# Patient Record
Sex: Male | Born: 1965 | Race: Black or African American | Hispanic: No | Marital: Single | Smoking: Current every day smoker
Health system: Southern US, Community
[De-identification: ages and names within clinical notes are randomized; demographics above are authoritative.]

## PROBLEM LIST (undated history)

## (undated) DIAGNOSIS — J45909 Unspecified asthma, uncomplicated: Secondary | ICD-10-CM

## (undated) DIAGNOSIS — J449 Chronic obstructive pulmonary disease, unspecified: Secondary | ICD-10-CM

---

## 2000-12-29 ENCOUNTER — Emergency Department (HOSPITAL_COMMUNITY): Admission: EM | Admit: 2000-12-29 | Discharge: 2000-12-29 | Payer: Self-pay | Admitting: Emergency Medicine

## 2000-12-29 ENCOUNTER — Encounter: Payer: Self-pay | Admitting: Emergency Medicine

## 2018-09-24 ENCOUNTER — Emergency Department (HOSPITAL_COMMUNITY): Payer: Self-pay

## 2018-09-24 ENCOUNTER — Encounter (HOSPITAL_COMMUNITY): Payer: Self-pay | Admitting: Emergency Medicine

## 2018-09-24 ENCOUNTER — Emergency Department (HOSPITAL_COMMUNITY)
Admission: EM | Admit: 2018-09-24 | Discharge: 2018-09-24 | Disposition: A | Discharge status: Home / Self Care | Payer: Self-pay | Attending: Emergency Medicine | Admitting: Emergency Medicine

## 2018-09-24 ENCOUNTER — Other Ambulatory Visit: Payer: Self-pay

## 2018-09-24 DIAGNOSIS — R05 Cough: Secondary | ICD-10-CM | POA: Insufficient documentation

## 2018-09-24 DIAGNOSIS — Z79899 Other long term (current) drug therapy: Secondary | ICD-10-CM | POA: Insufficient documentation

## 2018-09-24 DIAGNOSIS — R059 Cough, unspecified: Secondary | ICD-10-CM

## 2018-09-24 DIAGNOSIS — F17210 Nicotine dependence, cigarettes, uncomplicated: Secondary | ICD-10-CM | POA: Insufficient documentation

## 2018-09-24 DIAGNOSIS — Z590 Homelessness: Secondary | ICD-10-CM | POA: Insufficient documentation

## 2018-09-24 DIAGNOSIS — J449 Chronic obstructive pulmonary disease, unspecified: Secondary | ICD-10-CM | POA: Insufficient documentation

## 2018-09-24 HISTORY — DX: Unspecified asthma, uncomplicated: J45.909

## 2018-09-24 HISTORY — DX: Chronic obstructive pulmonary disease, unspecified: J44.9

## 2018-09-24 LAB — BASIC METABOLIC PANEL
Anion gap: 11 (ref 5–15)
BUN: 11 mg/dL (ref 6–20)
CO2: 26 mmol/L (ref 22–32)
CREATININE: 1.06 mg/dL (ref 0.61–1.24)
Calcium: 8.7 mg/dL — ABNORMAL LOW (ref 8.9–10.3)
Chloride: 102 mmol/L (ref 98–111)
GFR calc Af Amer: >60 mL/min (ref >60)
Glucose, Bld: 101 mg/dL — ABNORMAL HIGH (ref 70–99)
Potassium: 3.8 mmol/L (ref 3.5–5.1)
Sodium: 139 mmol/L (ref 135–145)

## 2018-09-24 LAB — CBC WITH DIFFERENTIAL/PLATELET
Abs Immature Granulocytes: 0.01 10*3/uL (ref 0.00–0.07)
Basophils Absolute: 0 10*3/uL (ref 0.0–0.1)
Basophils Relative: 0 %
Eosinophils Absolute: 0 10*3/uL (ref 0.0–0.5)
Eosinophils Relative: 0 %
HCT: 39.4 % (ref 39.0–52.0)
Hemoglobin: 12.6 g/dL — ABNORMAL LOW (ref 13.0–17.0)
Immature Granulocytes: 0 %
Lymphocytes Relative: 18 %
Lymphs Abs: 0.8 10*3/uL (ref 0.7–4.0)
MCH: 29.6 pg (ref 26.0–34.0)
MCHC: 32 g/dL (ref 30.0–36.0)
MCV: 92.5 fL (ref 80.0–100.0)
Monocytes Absolute: 0.5 10*3/uL (ref 0.1–1.0)
Monocytes Relative: 11 %
Neutro Abs: 3.2 10*3/uL (ref 1.7–7.7)
Neutrophils Relative %: 71 %
Platelets: 148 10*3/uL — ABNORMAL LOW (ref 150–400)
RBC: 4.26 MIL/uL (ref 4.22–5.81)
RDW: 13.5 % (ref 11.5–15.5)
WBC: 4.5 10*3/uL (ref 4.0–10.5)
nRBC: 0 % (ref 0.0–0.2)

## 2018-09-24 MED ORDER — METHYLPREDNISOLONE SODIUM SUCC 125 MG IJ SOLR
125.0000 mg | Freq: Once | INTRAMUSCULAR | Status: AC
  Administered 2018-09-24: 125 mg via INTRAVENOUS
  Filled 2018-09-24: qty 2

## 2018-09-24 MED ORDER — SODIUM CHLORIDE 0.9 % IV BOLUS
1000.0000 mL | Freq: Once | INTRAVENOUS | Status: AC
  Administered 2018-09-24: 1000 mL via INTRAVENOUS

## 2018-09-24 MED ORDER — IBUPROFEN 200 MG PO TABS
600.0000 mg | ORAL_TABLET | Freq: Once | ORAL | Status: AC
  Administered 2018-09-24: 600 mg via ORAL
  Filled 2018-09-24: qty 3

## 2018-09-24 MED ORDER — ALBUTEROL SULFATE HFA 108 (90 BASE) MCG/ACT IN AERS
2.0000 | INHALATION_SPRAY | RESPIRATORY_TRACT | Status: DC
  Administered 2018-09-24: 2 via RESPIRATORY_TRACT
  Filled 2018-09-24: qty 6.7

## 2018-09-24 MED ORDER — PREDNISONE 20 MG PO TABS: 20.0000 mg | ORAL_TABLET | Freq: Every day | ORAL | 0 refills | Status: DC

## 2018-09-24 NOTE — Discharge Instructions (Addendum)
Chest x-ray showed no pneumonia.  We have given you inhaler.  Prescription for prednisone.  This is an expensive.  You need primary care follow-up.

## 2018-09-24 NOTE — ED Triage Notes (Signed)
Pt is homeless and BIB [facility] c/o cough x3 days. Warm to the touch, headache. Lung sounds are clear per EMS. +cough. EMS states "probably needs to speak with a social worker"

## 2018-09-24 NOTE — ED Notes (Signed)
Patient very drowsy, dozing off between questions. But very polite and apologetic when woken to answer.

## 2018-09-24 NOTE — ED Notes (Signed)
Bed: HO12 Expected date:  Expected time:  Means of arrival:  Comments: 19M fever cough

## 2018-09-24 NOTE — Care Management Note (Signed)
Case Management Note  CM consulted for medication assistance.  Discussed with Dr. Adriana Simas who advised he would re-contact CM once all results were available and course of treatment was decided.  No home medication noted at this time.  Placed Lavinia Sharps, NP on AVS for follow up.  No further CM needs noted at this time until further notice from Dr. Adriana Simas.  Anik Wesch, Lynnae Sandhoff, RN 09/24/2018, 3:57 PM

## 2018-09-25 NOTE — ED Provider Notes (Signed)
[facility] Note   CSN: [id number] Arrival date & time: 09/24/18  [id number]     History   Chief Complaint Chief Complaint  Patient presents with  . Cough    HPI Jonathan Beck is a 53 y.o. male.  Cough for 3 days without fever, sweats, chills.  Patient is homeless and has had trouble finding appropriate housing.  No rusty sputum.  No substernal chest pain, dyspnea, diaphoresis, nausea.  Past medical history includes asthma and COPD.  He is a smoker.  Severity symptoms is mild.  Nothing makes symptoms better or worse.     Past Medical History:  Diagnosis Date  . Asthma   . COPD (chronic obstructive pulmonary disease) (HCC)     There are no active problems to display for this patient.   History reviewed. No pertinent surgical history.      Home Medications    Prior to Admission medications   Medication Sig Start Date End Date Taking? Authorizing Provider  naproxen sodium (ALEVE) 220 MG tablet Take 440 mg by mouth daily as needed (pain).    Yes [provider]  predniSONE (DELTASONE) 20 MG tablet Take 1 tablet (20 mg total) by mouth daily with breakfast. 09/24/18   Donnetta Hutchingook, Jaquis Picklesimer, MD    Family History History reviewed. No pertinent family history.  Social History Social History   Tobacco Use  . Smoking status: Current Every Day Smoker    Packs/day: 2.00    Types: Cigarettes  . Smokeless tobacco: Never Used  Substance Use Topics  . Alcohol use: Yes    Comment: "I don't know, too many"  . Drug use: Yes    Types: Marijuana, Cocaine     Allergies   Bee venom and Penicillins   Review of Systems Review of Systems  All other systems reviewed and are negative.    Physical Exam Updated Vital Signs BP 109/88   Pulse 86   Temp 98.7 F (37.1 C) (Oral)   Resp 14   Ht 5\' 11"  (1.803 m)   Wt 99.8 kg   SpO2 (!) 88%   BMI 30.68 kg/m   Physical Exam Vitals signs and nursing note reviewed.  Constitutional:    Appearance: He is well-developed.     Comments: nad  HENT:     Head: Normocephalic and atraumatic.  Eyes:     Conjunctiva/sclera: Conjunctivae normal.  Neck:     Musculoskeletal: Neck supple.  Cardiovascular:     Rate and Rhythm: Normal rate and regular rhythm.  Pulmonary:     Effort: Pulmonary effort is normal.     Breath sounds: Normal breath sounds.  Abdominal:     General: Bowel sounds are normal.     Palpations: Abdomen is soft.  Musculoskeletal: Normal range of motion.  Skin:    General: Skin is warm and dry.  Neurological:     Mental Status: He is alert and oriented to person, place, and time.  Psychiatric:        Behavior: Behavior normal.      ED Treatments / Results  Labs (all labs ordered are listed, but only abnormal results are displayed) Labs Reviewed  CBC WITH DIFFERENTIAL/PLATELET - Abnormal; Notable for the following components:      Result Value   Hemoglobin 12.6 (*)    Platelets 148 (*)    All other components within normal limits  BASIC METABOLIC PANEL - Abnormal; Notable for the following components:   Glucose, Bld 101 (*)  Calcium 8.7 (*)    All other components within normal limits    EKG None  Radiology Dg Chest 2 View  Result Date: 09/24/2018 CLINICAL DATA:  53 year old male with history of fever, cough and shortness of breath for the past 3 days. EXAM: CHEST - 2 VIEW COMPARISON:  No priors. FINDINGS: Lung volumes are low. No consolidative airspace disease. No pleural effusions. No pneumothorax. No pulmonary nodule or mass noted. Pulmonary vasculature and the cardiomediastinal silhouette are within normal limits. IMPRESSION: 1. Low lung volumes without radiographic evidence of acute cardiopulmonary disease. Electronically Signed   By: Trudie Reedaniel  Entrikin M.D.   On: 09/24/2018 10:14    Procedures Procedures (including critical care time)  Medications Ordered in ED Medications  sodium chloride 0.9 % bolus 1,000 mL (0 mLs Intravenous Stopped  09/24/18 0949)  methylPREDNISolone sodium succinate (SOLU-MEDROL) 125 mg/2 mL injection 125 mg (125 mg Intravenous Given 09/24/18 0849)  ibuprofen (ADVIL,MOTRIN) tablet 600 mg (600 mg Oral Given 09/24/18 0951)     Initial Impression / Assessment and Plan / ED Course  I have reviewed the triage vital signs and the nursing notes.  Pertinent labs & imaging results that were available during my care of the patient were reviewed by me and considered in my medical decision making (see chart for details).     History and physical most consistent with viral URI with underlying COPD.  Patient was given fluids and IV steroids.  Also Rx for albuterol inhaler and prednisone.  Hemodynamically stable at discharge  Final Clinical Impressions(s) / ED Diagnoses   Final diagnoses:  Cough    ED Discharge Orders         Ordered    predniSONE (DELTASONE) 20 MG tablet  Daily with breakfast     09/24/18 1402           Donnetta Hutchingook, Cherilyn Sautter, MD 09/25/18 [phone]

## 2018-11-22 ENCOUNTER — Encounter (HOSPITAL_COMMUNITY): Payer: Self-pay | Admitting: Emergency Medicine

## 2018-11-22 ENCOUNTER — Emergency Department (HOSPITAL_COMMUNITY): Payer: Self-pay

## 2018-11-22 ENCOUNTER — Other Ambulatory Visit: Payer: Self-pay

## 2018-11-22 ENCOUNTER — Emergency Department (HOSPITAL_COMMUNITY)
Admission: EM | Admit: 2018-11-22 | Discharge: 2018-11-22 | Disposition: A | Discharge status: Home / Self Care | Payer: Self-pay | Attending: Emergency Medicine | Admitting: Emergency Medicine

## 2018-11-22 DIAGNOSIS — F17210 Nicotine dependence, cigarettes, uncomplicated: Secondary | ICD-10-CM | POA: Insufficient documentation

## 2018-11-22 DIAGNOSIS — J441 Chronic obstructive pulmonary disease with (acute) exacerbation: Secondary | ICD-10-CM | POA: Insufficient documentation

## 2018-11-22 MED ORDER — IPRATROPIUM-ALBUTEROL 0.5-2.5 (3) MG/3ML IN SOLN
3.0000 mL | Freq: Once | RESPIRATORY_TRACT | Status: AC
  Administered 2018-11-22: 3 mL via RESPIRATORY_TRACT
  Filled 2018-11-22: qty 3

## 2018-11-22 MED ORDER — PREDNISONE 20 MG PO TABS
60.0000 mg | ORAL_TABLET | Freq: Once | ORAL | Status: AC
  Administered 2018-11-22: 60 mg via ORAL
  Filled 2018-11-22: qty 3

## 2018-11-22 MED ORDER — PREDNISONE 20 MG PO TABS: 60.0000 mg | ORAL_TABLET | Freq: Every day | ORAL | 0 refills | Status: AC

## 2018-11-22 MED ORDER — ALBUTEROL SULFATE HFA 108 (90 BASE) MCG/ACT IN AERS
4.0000 | INHALATION_SPRAY | Freq: Once | RESPIRATORY_TRACT | Status: AC
  Administered 2018-11-22: 4 via RESPIRATORY_TRACT
  Filled 2018-11-22: qty 6.7

## 2018-11-22 NOTE — ED Provider Notes (Signed)
[facility] EMERGENCY DEPARTMENT Provider Note   CSN: [id number] Arrival date & time: 11/22/18  0709    History   Chief Complaint Chief Complaint  Patient presents with  . Shortness of Breath  . Cough  . Chest Pain    HPI Jonathan Beck is a 53 y.o. male.     The history is provided by the patient.  Cough  Cough characteristics:  Productive Sputum characteristics:  Yellow Severity:  Moderate Onset quality:  Gradual Timing:  Constant Progression:  Worsening Chronicity:  New Smoker: yes   Context: upper respiratory infection   Relieved by:  Nothing Worsened by:  Smoking Associated symptoms: chest pain, chills, fever, myalgias, shortness of breath, sinus congestion and wheezing   Associated symptoms: no ear pain, no rash and no sore throat   Risk factors: no recent infection and no recent travel     Past Medical History:  Diagnosis Date  . Asthma   . COPD (chronic obstructive pulmonary disease) (HCC)     There are no active problems to display for this patient.   History reviewed. No pertinent surgical history.      Home Medications    Prior to Admission medications   Medication Sig Start Date End Date Taking? Authorizing Provider  naproxen sodium (ALEVE) 220 MG tablet Take 440 mg by mouth daily as needed (pain).     [provider]  predniSONE (DELTASONE) 20 MG tablet Take 3 tablets (60 mg total) by mouth daily for 4 days. 11/22/18 11/26/18  Virgina Norfolk, DO    Family History History reviewed. No pertinent family history.  Social History Social History   Tobacco Use  . Smoking status: Current Every Day Smoker    Packs/day: 1.00    Types: Cigarettes  . Smokeless tobacco: Never Used  Substance Use Topics  . Alcohol use: Yes    Comment: "I don't know, too many"  . Drug use: Yes    Types: Marijuana, Cocaine     Allergies   Bee venom and Penicillins   Review of Systems Review of Systems  Constitutional: Positive for  chills and fever.  HENT: Negative for ear pain and sore throat.   Eyes: Negative for pain and visual disturbance.  Respiratory: Positive for cough, shortness of breath and wheezing.   Cardiovascular: Positive for chest pain. Negative for palpitations.  Gastrointestinal: Negative for abdominal pain and vomiting.  Genitourinary: Negative for dysuria and hematuria.  Musculoskeletal: Positive for myalgias. Negative for arthralgias and back pain.  Skin: Negative for color change and rash.  Neurological: Negative for seizures and syncope.  All other systems reviewed and are negative.    Physical Exam Updated Vital Signs  ED Triage Vitals  Enc Vitals Group     BP 11/22/18 0729 138/78     Pulse Rate 11/22/18 0729 79     Resp 11/22/18 0729 19     Temp 11/22/18 0729 99.3 F (37.4 C)     Temp Source 11/22/18 0729 Oral     SpO2 11/22/18 0729 99 %     Weight 11/22/18 0722 230 lb (104.3 kg)     Height 11/22/18 0722  (1.803 m)     Head Circumference --      Peak Flow --      Pain Score 11/22/18 0722 2     Pain Loc --      Pain Edu? --      Excl. in [facility]? --     Physical Exam  Vitals signs and nursing note reviewed.  Constitutional:      General: He is not in acute distress.    Appearance: He is well-developed. He is not ill-appearing.  HENT:     Head: Normocephalic and atraumatic.     Mouth/Throat:     Mouth: Mucous membranes are moist.  Eyes:     Conjunctiva/sclera: Conjunctivae normal.     Pupils: Pupils are equal, round, and reactive to light.  Neck:     Musculoskeletal: Normal range of motion and neck supple.  Cardiovascular:     Rate and Rhythm: Normal rate and regular rhythm.     Pulses: Normal pulses.     Heart sounds: Normal heart sounds. No murmur.  Pulmonary:     Effort: Pulmonary effort is normal. No tachypnea or respiratory distress.     Breath sounds: Wheezing present. No decreased breath sounds, rhonchi or rales.  Chest:     Chest wall: No tenderness.    Abdominal:     Palpations: Abdomen is soft.     Tenderness: There is no abdominal tenderness.  Musculoskeletal: Normal range of motion.     Right lower leg: No edema.     Left lower leg: No edema.  Skin:    General: Skin is warm and dry.     Capillary Refill: Capillary refill takes less than 2 seconds.  Neurological:     General: No focal deficit present.     Mental Status: He is alert.  Psychiatric:        Mood and Affect: Mood normal.      ED Treatments / Results  Labs (all labs ordered are listed, but only abnormal results are displayed) Labs Reviewed - No data to display  EKG EKG Interpretation  Date/Time:  Saturday November 22 2018 07:26:05 EST Ventricular Rate:  78 PR Interval:    QRS Duration: 110 QT Interval:  392 QTC Calculation: 447 R Axis:   -31 Text Interpretation:  Sinus rhythm Left axis deviation Abnormal R-wave progression, late transition Confirmed by Virgina Norfolk [phone]) on 11/22/2018 7:29:47 AM   Radiology Dg Chest 2 View  Result Date: 11/22/2018 CLINICAL DATA:  Pt complains of increased cough, wheezing, SOB, and chest tightness x 2 weeks; he reports hx of COPD and HTN; smoker EXAM: CHEST - 2 VIEW COMPARISON:  09/24/2018 FINDINGS: The heart size and mediastinal contours are within normal limits. Both lungs are clear. No pleural effusion or pneumothorax. The visualized skeletal structures are unremarkable. IMPRESSION: No active cardiopulmonary disease. Electronically Signed   By: Amie Portland M.D.   On: 11/22/2018 08:30    Procedures Procedures (including critical care time)  Medications Ordered in ED Medications  ipratropium-albuterol (DUONEB) 0.5-2.5 (3) MG/3ML nebulizer solution 3 mL (3 mLs Nebulization Given 11/22/18 0727)  albuterol (PROVENTIL HFA;VENTOLIN HFA) 108 (90 Base) MCG/ACT inhaler 4 puff (4 puffs Inhalation Given 11/22/18 0727)  predniSONE (DELTASONE) tablet 60 mg (60 mg Oral Given 11/22/18 0727)     Initial Impression / Assessment  and Plan / ED Course  I have reviewed the triage vital signs and the nursing notes.  Pertinent labs & imaging results that were available during my care of the patient were reviewed by me and considered in my medical decision making (see chart for details).        Jonathan Beck is a 53 year old male with history of COPD, smoking who presents to the ED with cough, shortness of breath, chest tightness.  Patient with overall unremarkable vitals.  No fever.  Patient with cough, sputum production, symptoms for the last several days.  Did not get his flu shot this year.  Continues to smoke.  Denies any leg swelling, abdominal pain.  Patient has some scattered wheezing on exam.  Overall has normal work of breathing.  Has good pulses throughout.  EKG showed sinus rhythm.  No signs of ischemic changes.  Chest pain does not appear cardiac in origin.  History and physical is more consistent with COPD/asthma exacerbation likely from viral process.  Chest x-ray was obtained that did not show any obvious pneumonia, pneumothorax, pleural effusion.  Patient was given DuoNeb treatment, prednisone with improvement.  Given albuterol inhaler to go home with as he states that he has not had one for the last several days.  No concern for PE given history and physical.  Wells criteria 0.  Given return precautions.  Recommend follow-up with primary care doctorc if symptoms not improved.  This chart was dictated using voice recognition software.  Despite best efforts to proofread,  errors can occur which can change the documentation meaning.    Final Clinical Impressions(s) / ED Diagnoses   Final diagnoses:  COPD exacerbation [facility])    ED Discharge Orders         Ordered    predniSONE (DELTASONE) 20 MG tablet  Daily     11/22/18 0835           Virgina Norfolk, DO 11/22/18 [phone]

## 2018-11-22 NOTE — Discharge Instructions (Addendum)
Use albuterol inhaler 4 puffs every 4 hours while awake for the next 24 hours and then as needed after that.  There was no sign of infection on chest x-ray today.  Take prednisone daily for the next 4 days.  Suspect that you have an asthma/COPD exacerbation that will improve with inhaler use and steroids.  Return to the ED if her symptoms worsen.

## 2018-11-22 NOTE — ED Notes (Signed)
Discharge instructions (including medications) discussed with and copy provided to patient/caregiver.  Pt verbalizes understanding of d/c instructions.  Armband removed, pt discharged from ED.   

## 2018-11-22 NOTE — ED Triage Notes (Signed)
Pt here with c/o chest tightness with inspiration, shob, and cough x2 weeks.  Pt is 1 pack a day smoker.  Pt also endorses intermittent nausea.

## 2018-11-22 NOTE — ED Notes (Signed)
Patient transported to X-ray 

## 2020-01-10 IMAGING — CR DG CHEST 2V
3 series · 3 of 3 positions shown · non-contrast
Comparison: No priors.

CLINICAL DATA: 52-year-old male with history of fever, cough and
shortness of breath for the past 3 days.

EXAM:
CHEST - 2 VIEW

[w chest lat]
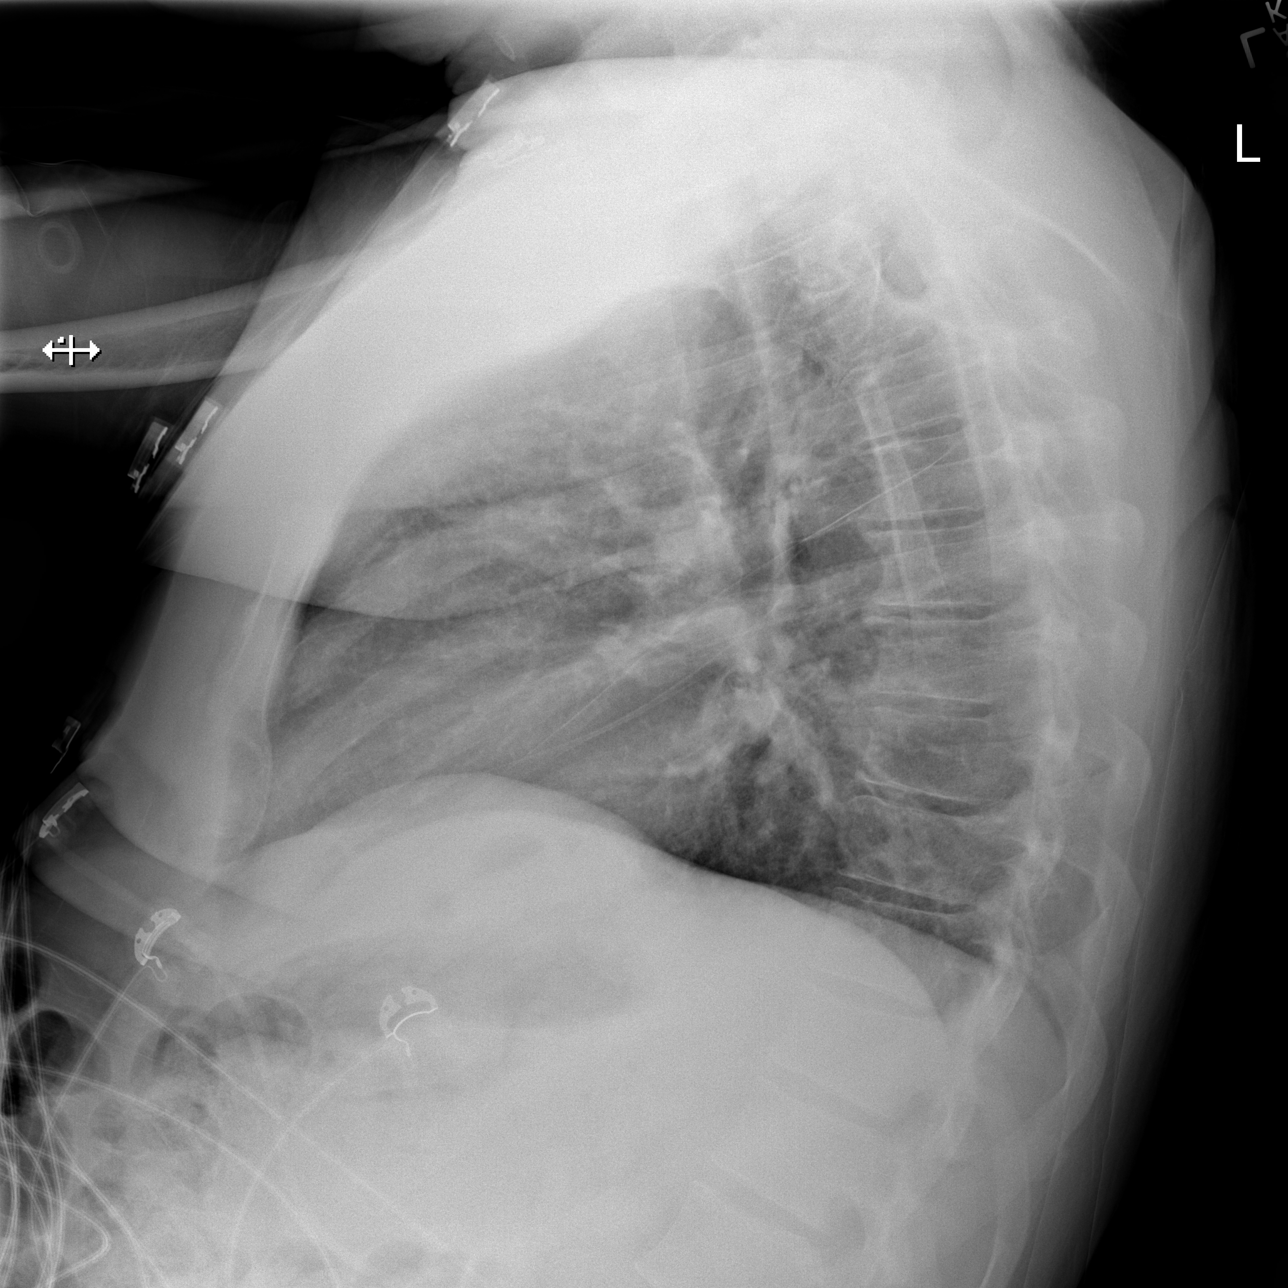

[x chest ap (1 of 2)]
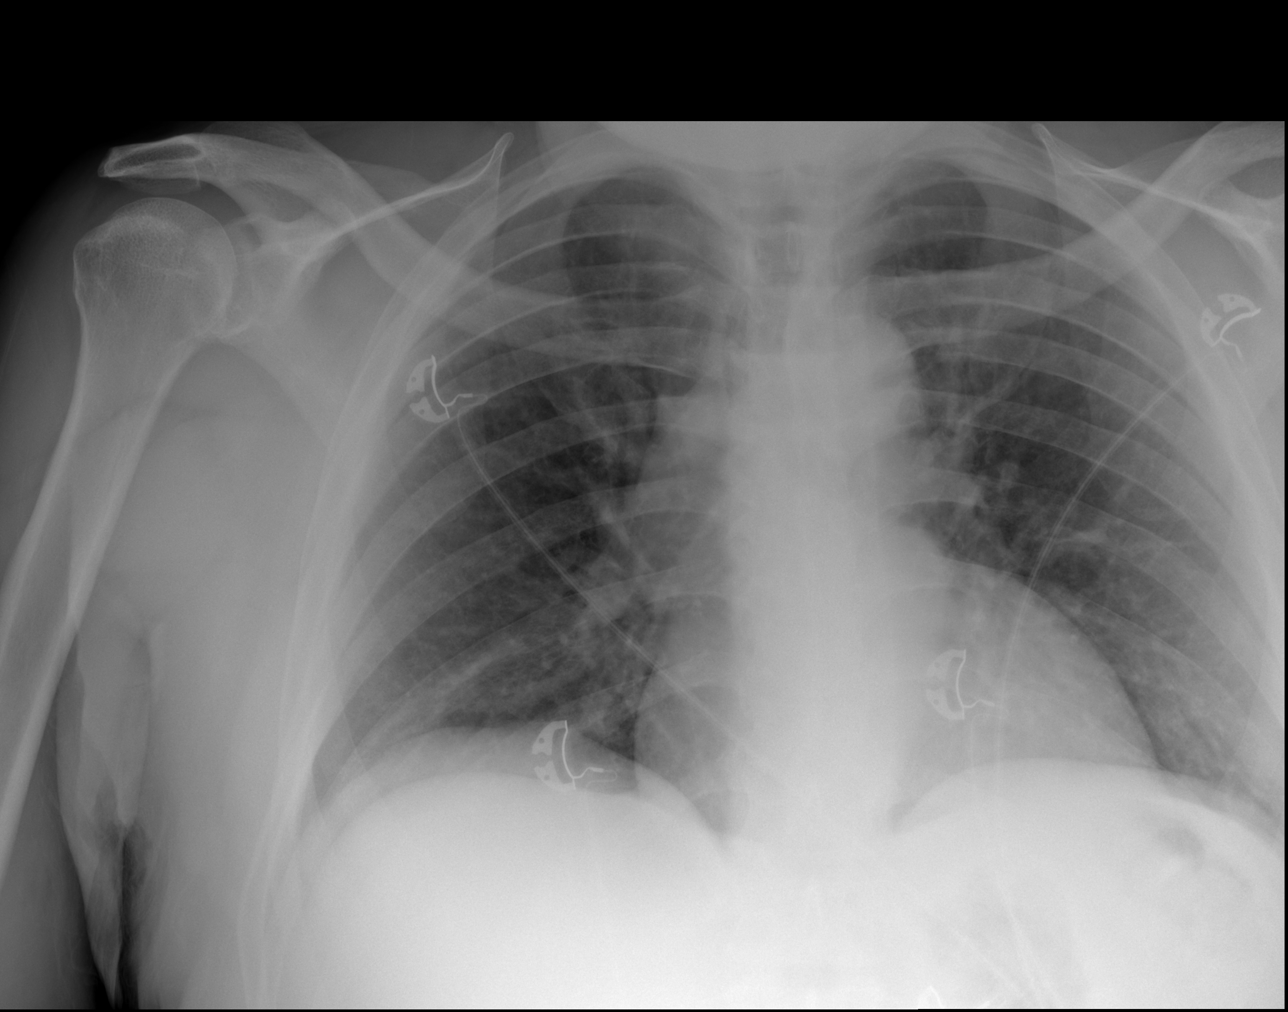

[x chest ap (2 of 2)]
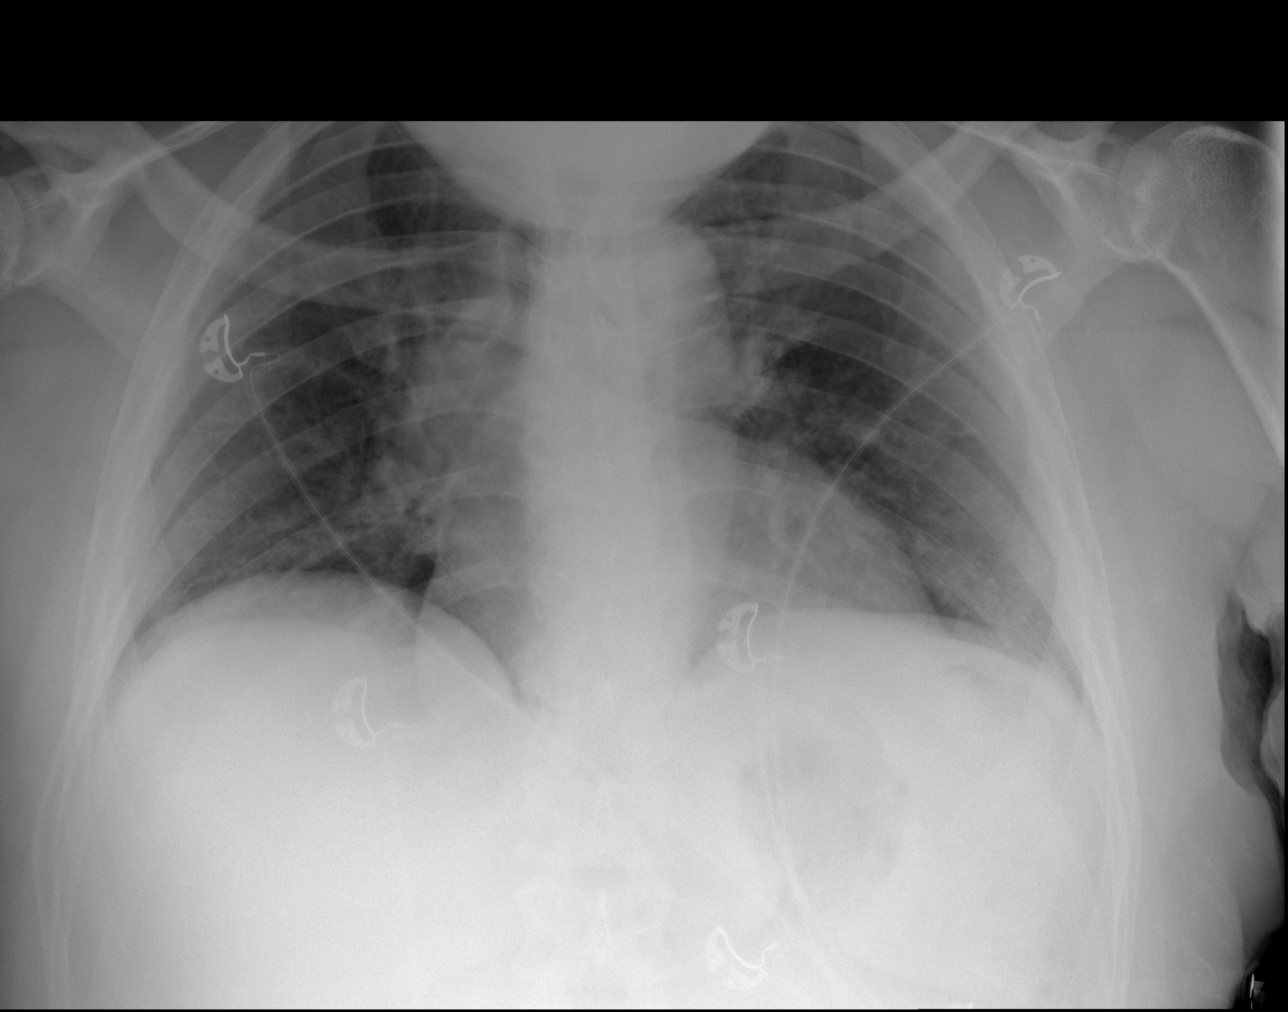

[3 of 3 positions shown; findings below may reference images not displayed]

FINDINGS: Lung volumes are low. No consolidative airspace disease. No pleural
effusions. No pneumothorax. No pulmonary nodule or mass noted.
Pulmonary vasculature and the cardiomediastinal silhouette are
within normal limits.
IMPRESSION: 1. Low lung volumes without radiographic evidence of acute
cardiopulmonary disease.
